# Patient Record
Sex: Female | Born: 1990 | Race: Black or African American | Hispanic: No | Marital: Single | Smoking: Current every day smoker
Health system: Southern US, Community
[De-identification: ages and names within clinical notes are randomized; demographics above are authoritative.]

## PROBLEM LIST (undated history)

## (undated) DIAGNOSIS — E119 Type 2 diabetes mellitus without complications: Secondary | ICD-10-CM

## (undated) HISTORY — DX: Type 2 diabetes mellitus without complications: E11.9

---
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
## 2009-11-14 IMAGING — CR DG FINGER THUMB 2+V*R*
1 series · 1 of 1 positions shown · non-contrast
Comparison: None

CLINICAL DATA: Injured finger on 11/06/2007 with pain and swelling

RIGHT THUMB 2+V

[view not recorded]
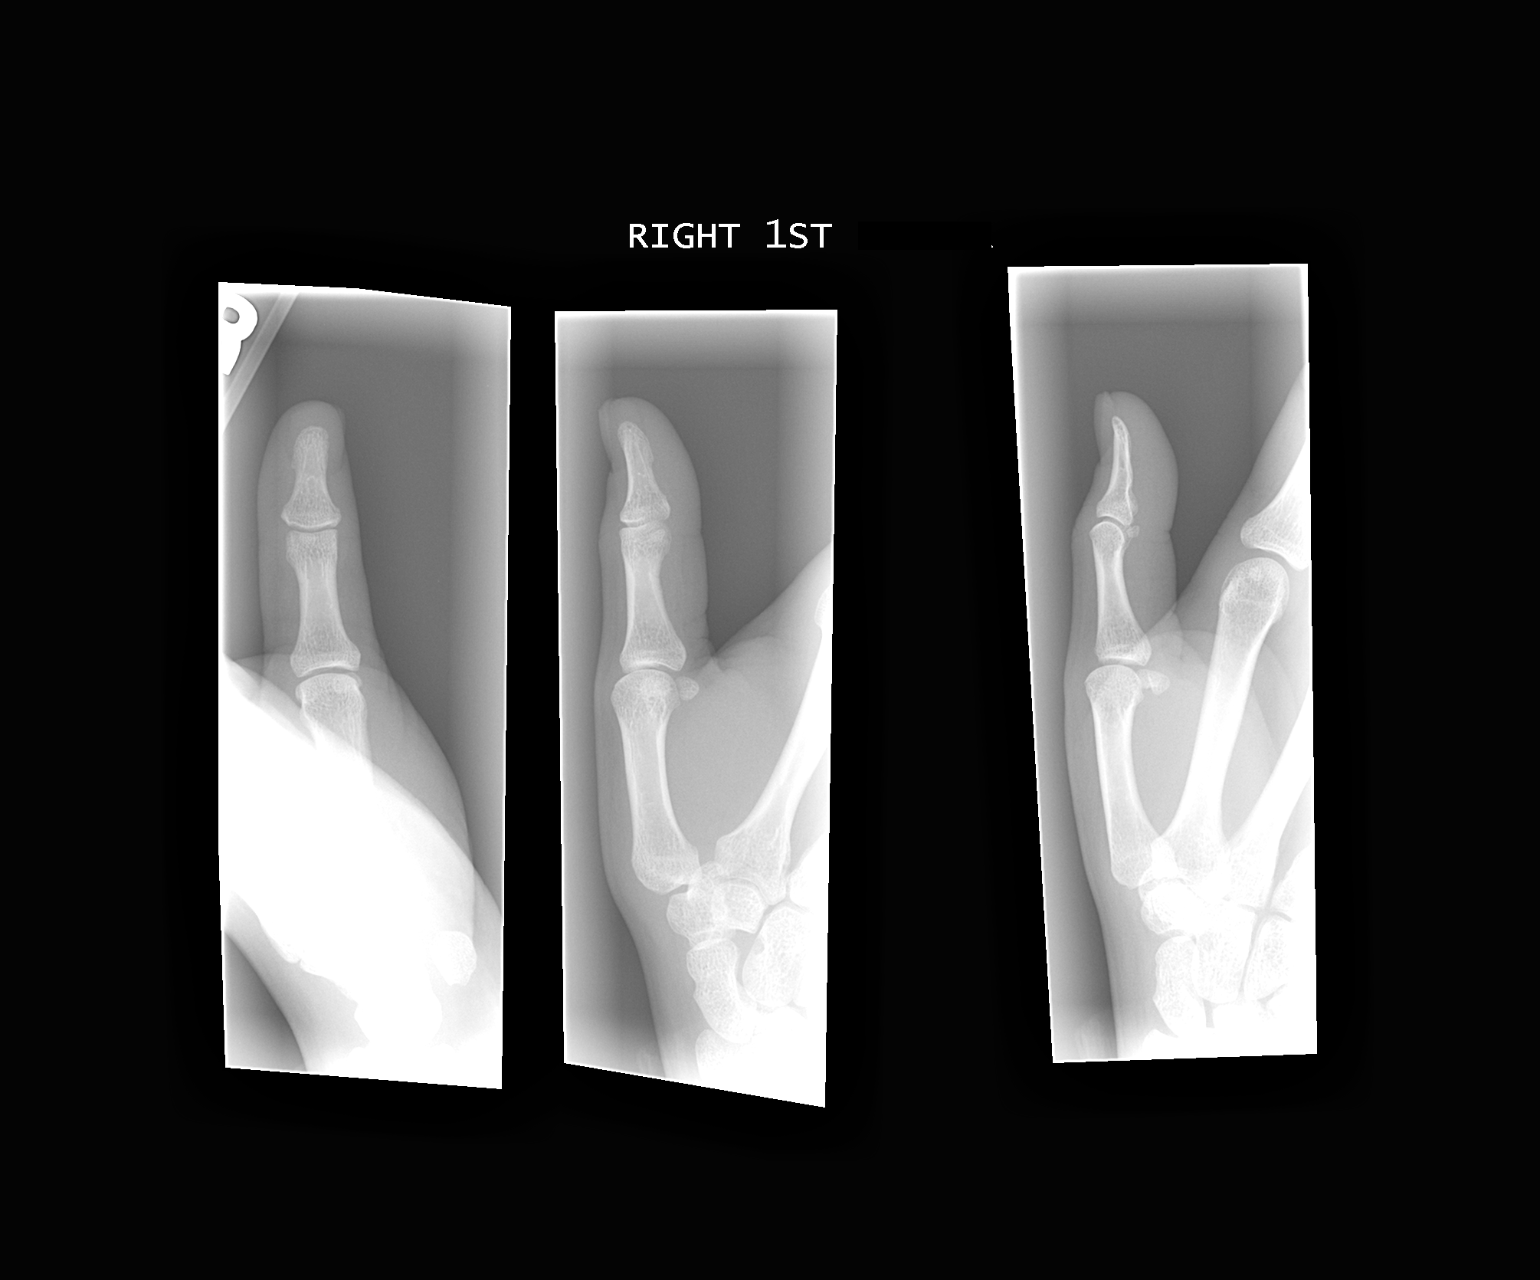

[1 of 1 positions shown; findings below may reference images not displayed]

FINDINGS: No acute bony abnormality is seen.  Alignment is normal.
IMPRESSION: No fracture or dislocation.

## 2010-10-06 ENCOUNTER — Ambulatory Visit (INDEPENDENT_AMBULATORY_CARE_PROVIDER_SITE_OTHER): Payer: Self-pay | Admitting: Internal Medicine

## 2010-10-06 ENCOUNTER — Encounter: Payer: Self-pay | Admitting: Internal Medicine

## 2010-10-06 VITALS — BP 100/62 | HR 90 | Temp 98.9°F | Resp 18 | Ht 62.0 in | Wt 143.0 lb

## 2010-10-06 DIAGNOSIS — E010 Iodine-deficiency related diffuse (endemic) goiter: Secondary | ICD-10-CM

## 2010-10-06 DIAGNOSIS — E049 Nontoxic goiter, unspecified: Secondary | ICD-10-CM

## 2010-10-06 MED ORDER — FLUTICASONE PROPIONATE 50 MCG/ACT NA SUSP: 1.0000 | Freq: Every day | NASAL | Status: DC

## 2010-10-06 NOTE — Progress Notes (Signed)
  Subjective:    Patient ID: Shari Faulkner, female    DOB: [birth year], 20 y.o.   MRN: [id number]  HPI   20 year old patient who is seen today to establish with our practice. 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   For the past 3 days she has noted some sinus congestion and fullness. She has had no fever or purulent drainage postnasal drip. She has been using Benadryl with the modest benefit. She states that springtime usually is associated with some red itchy eyes.  Her past medical history is unremarkable except for wisdom teeth extraction.  Family history is remarkable for hypertension with her father one brother and one sister are in good health  A grandmother had breast cancer diabetes and cerebral vascular disease  Review of Systems  Constitutional: Negative.   HENT: Negative for hearing loss, congestion, sore throat, rhinorrhea, dental problem, sinus pressure and tinnitus.   Eyes: Negative for pain, discharge and visual disturbance.  Respiratory: Negative for cough and shortness of breath.   Cardiovascular: Negative for chest pain, palpitations and leg swelling.  Gastrointestinal: Negative for nausea, vomiting, abdominal pain, diarrhea, constipation, blood in stool and abdominal distention.  Genitourinary: Negative for dysuria, urgency, frequency, hematuria, flank pain, vaginal bleeding, vaginal discharge, difficulty urinating, vaginal pain and pelvic pain.  Musculoskeletal: Negative for joint swelling, arthralgias and gait problem.  Skin: Negative for rash.  Neurological: Negative for dizziness, syncope, speech difficulty, weakness, numbness and headaches.  Hematological: Negative for adenopathy.  Psychiatric/Behavioral: Negative for behavioral problems, dysphoric mood and agitation. The patient is not nervous/anxious.        Objective:   Physical Exam  Constitutional: She is oriented to person, place, and time. She appears well-developed and well-nourished.    HENT:  Head: Normocephalic and atraumatic.  Right Ear: External ear normal.  Left Ear: External ear normal.  Mouth/Throat: Oropharynx is clear and moist.  Eyes: Conjunctivae and EOM are normal. Left eye exhibits no discharge.  Neck: Normal range of motion. Neck supple. No JVD present. Thyromegaly present.        Moderate-sized thyromegaly noted  Cardiovascular: Normal rate, regular rhythm, normal heart sounds and intact distal pulses.   No murmur heard. Pulmonary/Chest: Effort normal and breath sounds normal. She has no wheezes. She has no rales.  Abdominal: Soft. Bowel sounds are normal. She exhibits no distension and no mass. There is no tenderness. There is no rebound and no guarding.  Musculoskeletal: Normal range of motion. She exhibits no edema and no tenderness.  Neurological: She is alert and oriented to person, place, and time. She has normal reflexes. No cranial nerve deficit. She exhibits normal muscle tone. Coordination normal.  Skin: Skin is warm and dry. No rash noted.  Psychiatric: She has a normal mood and affect. Her behavior is normal.          Assessment & Plan:   URI versus allergic rhinitis. We'll recommend over-the-counter Allegra or Alavert. We'll give a prescription for Flonase if symptoms persist. We'll check a TSH  Thyromegaly. We'll check a TSH

## 2010-10-06 NOTE — Patient Instructions (Signed)
Call or return to clinic prn if these symptoms worsen or fail to improve as anticipated.

## 2021-08-12 DIAGNOSIS — Z6836 Body mass index (BMI) 36.0-36.9, adult: Secondary | ICD-10-CM | POA: Diagnosis not present

## 2021-08-12 DIAGNOSIS — Z01419 Encounter for gynecological examination (general) (routine) without abnormal findings: Secondary | ICD-10-CM | POA: Diagnosis not present

## 2021-08-12 DIAGNOSIS — F419 Anxiety disorder, unspecified: Secondary | ICD-10-CM | POA: Diagnosis not present

## 2021-08-12 DIAGNOSIS — Z634 Disappearance and death of family member: Secondary | ICD-10-CM | POA: Diagnosis not present

## 2021-08-27 DIAGNOSIS — Z113 Encounter for screening for infections with a predominantly sexual mode of transmission: Secondary | ICD-10-CM | POA: Diagnosis not present

## 2021-08-27 DIAGNOSIS — N898 Other specified noninflammatory disorders of vagina: Secondary | ICD-10-CM | POA: Diagnosis not present

## 2021-11-26 DIAGNOSIS — L810 Postinflammatory hyperpigmentation: Secondary | ICD-10-CM | POA: Diagnosis not present

## 2021-11-26 DIAGNOSIS — L74510 Primary focal hyperhidrosis, axilla: Secondary | ICD-10-CM | POA: Diagnosis not present

## 2022-02-13 DIAGNOSIS — N898 Other specified noninflammatory disorders of vagina: Secondary | ICD-10-CM | POA: Diagnosis not present

## 2022-02-13 DIAGNOSIS — R102 Pelvic and perineal pain: Secondary | ICD-10-CM | POA: Diagnosis not present

## 2022-02-13 DIAGNOSIS — E559 Vitamin D deficiency, unspecified: Secondary | ICD-10-CM | POA: Diagnosis not present

## 2022-04-07 DIAGNOSIS — N898 Other specified noninflammatory disorders of vagina: Secondary | ICD-10-CM | POA: Diagnosis not present

## 2022-04-07 DIAGNOSIS — G4700 Insomnia, unspecified: Secondary | ICD-10-CM | POA: Diagnosis not present

## 2022-04-07 DIAGNOSIS — R319 Hematuria, unspecified: Secondary | ICD-10-CM | POA: Diagnosis not present

## 2022-04-07 DIAGNOSIS — R102 Pelvic and perineal pain: Secondary | ICD-10-CM | POA: Diagnosis not present

## 2022-04-07 DIAGNOSIS — N915 Oligomenorrhea, unspecified: Secondary | ICD-10-CM | POA: Diagnosis not present

## 2022-04-08 DIAGNOSIS — R319 Hematuria, unspecified: Secondary | ICD-10-CM | POA: Diagnosis not present

## 2022-04-08 DIAGNOSIS — R102 Pelvic and perineal pain: Secondary | ICD-10-CM | POA: Diagnosis not present

## 2022-07-13 DIAGNOSIS — N898 Other specified noninflammatory disorders of vagina: Secondary | ICD-10-CM | POA: Diagnosis not present

## 2022-07-13 DIAGNOSIS — Z309 Encounter for contraceptive management, unspecified: Secondary | ICD-10-CM | POA: Diagnosis not present

## 2022-07-13 DIAGNOSIS — N6489 Other specified disorders of breast: Secondary | ICD-10-CM | POA: Diagnosis not present

## 2022-08-12 DIAGNOSIS — Z6833 Body mass index (BMI) 33.0-33.9, adult: Secondary | ICD-10-CM | POA: Diagnosis not present

## 2022-08-12 DIAGNOSIS — R350 Frequency of micturition: Secondary | ICD-10-CM | POA: Diagnosis not present

## 2022-08-12 DIAGNOSIS — Z01419 Encounter for gynecological examination (general) (routine) without abnormal findings: Secondary | ICD-10-CM | POA: Diagnosis not present

## 2022-08-12 DIAGNOSIS — N898 Other specified noninflammatory disorders of vagina: Secondary | ICD-10-CM | POA: Diagnosis not present

## 2022-08-12 DIAGNOSIS — Z124 Encounter for screening for malignant neoplasm of cervix: Secondary | ICD-10-CM | POA: Diagnosis not present

## 2022-08-19 DIAGNOSIS — L304 Erythema intertrigo: Secondary | ICD-10-CM | POA: Diagnosis not present

## 2022-08-19 DIAGNOSIS — N62 Hypertrophy of breast: Secondary | ICD-10-CM | POA: Diagnosis not present

## 2022-08-19 DIAGNOSIS — M542 Cervicalgia: Secondary | ICD-10-CM | POA: Diagnosis not present

## 2022-08-19 DIAGNOSIS — M549 Dorsalgia, unspecified: Secondary | ICD-10-CM | POA: Diagnosis not present

## 2022-09-07 DIAGNOSIS — R81 Glycosuria: Secondary | ICD-10-CM | POA: Diagnosis not present

## 2022-09-08 LAB — LAB REPORT - SCANNED
A1c: 12.1
EGFR: 113

## 2022-10-01 ENCOUNTER — Ambulatory Visit: Payer: Federal, State, Local not specified - PPO | Admitting: Family Medicine

## 2022-10-16 ENCOUNTER — Ambulatory Visit: Payer: Self-pay | Admitting: Family Medicine

## 2022-11-02 ENCOUNTER — Ambulatory Visit: Payer: BC Managed Care – PPO | Admitting: Family Medicine

## 2022-11-02 ENCOUNTER — Encounter: Payer: Self-pay | Admitting: Family Medicine

## 2022-11-02 VITALS — BP 96/60 | HR 75 | Temp 98.6°F | Ht 62.0 in | Wt 166.0 lb

## 2022-11-02 DIAGNOSIS — R739 Hyperglycemia, unspecified: Secondary | ICD-10-CM

## 2022-11-02 DIAGNOSIS — E119 Type 2 diabetes mellitus without complications: Secondary | ICD-10-CM | POA: Insufficient documentation

## 2022-11-02 LAB — POCT GLYCOSYLATED HEMOGLOBIN (HGB A1C): Hemoglobin A1C: 14.1 % — AB (ref 4.0–5.6)

## 2022-11-02 MED ORDER — METFORMIN HCL 500 MG PO TABS: ORAL_TABLET | ORAL | 1 refills | Status: DC

## 2022-11-02 NOTE — Patient Instructions (Signed)
Start metformin 500 mg once in the morning for 7 days                           500 mg twice a day for 7 days, then                           1000 mg in the morning and 500 mg  at night for 7 days, then                           1000 mg twice a day

## 2022-11-02 NOTE — Progress Notes (Signed)
New Patient Office Visit  Subjective    Patient ID: FEBBIE FORBESS, female    DOB: January 07, 1991  Age: 32 y.o. MRN: [id number]  CC:  Chief Complaint  Patient presents with   Establish Care   Diabetes    Patient states her Alba Cory, [place]  recently diagnosed her with diabetes and advised follow up with PCP    HPI REINA COCKMAN presents to establish care Pt had blood work done recently and it showed that her A1C was "in the diabetic range" and was told to discuss with a primary care. She reports that this bloodwork was done about 2 months ago and since then she has reduced sugar in her diet and states that she has lost about 30 pounds. States her diet consists of tuna, fruit, drinks water and tea only, eats meat and a bit of rice. A1C performed in office today and is 14.1 We had a long discussion about medications and she is agreeable to starting metformin and titrating the dose up to 1000 mg BID.   Pt reports a family history of diabetes in her father and brother.   I reviewed all aspects of the patient's medical history including social history, family history, surgical history.   Outpatient Encounter Medications as of 11/02/2022  Medication Sig   metFORMIN (GLUCOPHAGE) 500 MG tablet Take 1 tablet (500 mg total) by mouth daily with breakfast for 7 days, THEN 1 tablet (500 mg total) 2 (two) times daily with a meal for 7 days. Then increase to 1000 mg in the morning and 500 at night for 7 days, then 1000 mg twice a day.Marland Kitchen   VITAMIN D PO Take 400 Units by mouth 2 (two) times daily.   [DISCONTINUED] fluticasone (FLONASE) 50 MCG/ACT nasal spray 1 spray by Nasal route daily.   No facility-administered encounter medications on file as of 11/02/2022.    Past Medical History:  Diagnosis Date   Diabetes     History reviewed. No pertinent surgical history.  Family History  Problem Relation Age of Onset   Ovarian cancer Mother    Brain cancer Mother    Hypertension Father     Diabetes Father    Prostate cancer Father    Diabetes Brother    Diabetes Maternal Grandmother    Breast cancer Paternal Grandmother     Social History   Socioeconomic History   Marital status: Single    Spouse name: Not on file   Number of children: Not on file   Years of education: Not on file   Highest education level: Not on file  Occupational History   Not on file  Tobacco Use   Smoking status: Never   Smokeless tobacco: Never  Vaping Use   Vaping Use: Former  Substance and Sexual Activity   Alcohol use: Not Currently   Drug use: Not Currently    Types: Marijuana   Sexual activity: Yes  Other Topics Concern   Not on file  Social History Narrative   Not on file   Social Determinants of Health   Financial Resource Strain: Not on file  Food Insecurity: Not on file  Transportation Needs: Not on file  Physical Activity: Not on file  Stress: Not on file  Social Connections: Not on file  Intimate Partner Violence: Not on file    Review of Systems  All other systems reviewed and are negative.       Objective    BP 96/60 (BP Location: Left Arm,  Patient Position: Sitting, Cuff Size: Normal)   Pulse 75   Temp 98.6 F (37 C) (Oral)   Ht 5\' 2"  (1.575 m)   Wt 166 lb (75.3 kg)   LMP 10/02/2022   SpO2 99%   BMI 30.36 kg/m   Physical Exam Vitals reviewed.  Constitutional:      Appearance: Normal appearance. She is well-groomed and overweight.  Eyes:     Conjunctiva/sclera: Conjunctivae normal.  Neck:     Thyroid: No thyromegaly.  Cardiovascular:     Rate and Rhythm: Normal rate and regular rhythm.     Pulses: Normal pulses.     Heart sounds: S1 normal and S2 normal.  Pulmonary:     Effort: Pulmonary effort is normal.     Breath sounds: Normal breath sounds and air entry.  Abdominal:     General: Bowel sounds are normal.  Musculoskeletal:     Right lower leg: No edema.     Left lower leg: No edema.  Neurological:     Mental Status: She is alert  and oriented to person, place, and time. Mental status is at baseline.     Gait: Gait is intact.  Psychiatric:        Mood and Affect: Mood and affect normal.        Speech: Speech normal.        Behavior: Behavior normal.        Judgment: Judgment normal.         Assessment & Plan:   Problem List Items Addressed This Visit       Unprioritized   Diabetes mellitus without complication (Chronic)    We had a long discussion about medications and also diet control. I gave her a list of low carb foods and I gave her specific goals on carb intake. I will see her every 3 months for medication adjustment and A1C check until her blood sugar is under control.       Relevant Medications   metFORMIN (GLUCOPHAGE) 500 MG tablet   Other Visit Diagnoses     Hyperglycemia    -  Primary   Relevant Orders   POC HgB A1c (Completed)       Return in about 3 months (around 02/01/2023) for DM.   Karie Georges, MD

## 2022-11-04 NOTE — Assessment & Plan Note (Signed)
We had a long discussion about medications and also diet control. I gave her a list of low carb foods and I gave her specific goals on carb intake. I will see her every 3 months for medication adjustment and A1C check until her blood sugar is under control.

## 2022-11-05 DIAGNOSIS — N898 Other specified noninflammatory disorders of vagina: Secondary | ICD-10-CM | POA: Diagnosis not present

## 2022-12-07 DIAGNOSIS — S46912A Strain of unspecified muscle, fascia and tendon at shoulder and upper arm level, left arm, initial encounter: Secondary | ICD-10-CM | POA: Diagnosis not present

## 2022-12-07 DIAGNOSIS — S46911A Strain of unspecified muscle, fascia and tendon at shoulder and upper arm level, right arm, initial encounter: Secondary | ICD-10-CM | POA: Diagnosis not present

## 2022-12-07 DIAGNOSIS — S39012A Strain of muscle, fascia and tendon of lower back, initial encounter: Secondary | ICD-10-CM | POA: Diagnosis not present

## 2023-02-01 ENCOUNTER — Ambulatory Visit: Payer: BC Managed Care – PPO | Admitting: Family Medicine

## 2023-02-01 ENCOUNTER — Encounter: Payer: Self-pay | Admitting: Family Medicine

## 2023-02-01 VITALS — BP 104/62 | HR 60 | Temp 98.6°F | Ht 62.0 in | Wt 156.3 lb

## 2023-02-01 DIAGNOSIS — E119 Type 2 diabetes mellitus without complications: Secondary | ICD-10-CM

## 2023-02-01 DIAGNOSIS — Z7984 Long term (current) use of oral hypoglycemic drugs: Secondary | ICD-10-CM | POA: Diagnosis not present

## 2023-02-01 DIAGNOSIS — R110 Nausea: Secondary | ICD-10-CM

## 2023-02-01 LAB — POCT GLYCOSYLATED HEMOGLOBIN (HGB A1C): Hemoglobin A1C: 13.2 % — AB (ref 4.0–5.6)

## 2023-02-01 MED ORDER — METFORMIN HCL ER 750 MG PO TB24: 750.0000 mg | ORAL_TABLET | Freq: Every day | ORAL | 1 refills | Status: DC

## 2023-02-01 MED ORDER — ONDANSETRON HCL 4 MG PO TABS: 4.0000 mg | ORAL_TABLET | Freq: Three times a day (TID) | ORAL | 2 refills | Status: DC | PRN

## 2023-02-01 NOTE — Assessment & Plan Note (Signed)
A1C is better at 13.2, however she could not titrate up the metformin like we planned due to severe nausea, she was able to tolerate 500 mg daily. We discussed options and we will switch her to long acting metformin at 750 mg daily. Patient will also start berberine 500-600 mg three times daily to reduce blood sugars. RTC in 3 months for repeat A1C. I have requested her bloodwork from her previous provider for review.

## 2023-02-01 NOTE — Progress Notes (Signed)
Established Patient Office Visit  Subjective   Patient ID: Shari Faulkner, female    DOB: 09/14/1990  Age: 32 y.o. MRN: [id number]  Chief Complaint  Patient presents with   Medical Management of Chronic Issues    Patient states she is only taking Metformin once a day due to "feeling uneasy and nauseous"    Pt is here for follow up today. States that she is working out more, but she thinks that her sugars are still not where they should be, states that she could not increase the metformin due to nausea, is only taking 500 mg daily. Foot exam performed today and her A1C was 13.2, down from 14.1. we discussed different medication options including GLP, SGLT 2 and berberine. Pt wants to try the berberine first.     Current Outpatient Medications  Medication Instructions   metFORMIN (GLUCOPHAGE-XR) 750 mg, Oral, Daily with breakfast   ondansetron (ZOFRAN) 4 mg, Oral, Every 8 hours PRN   VITAMIN D PO 400 Units, Oral, 2 times daily       Review of Systems  All other systems reviewed and are negative.     Objective:     BP 104/62 (BP Location: Left Arm, Patient Position: Sitting, Cuff Size: Normal)   Pulse 60   Temp 98.6 F (37 C) (Oral)   Ht 5\' 2"  (1.575 m)   Wt 156 lb 4.8 oz (70.9 kg)   LMP 01/27/2023 (Exact Date)   SpO2 98%   BMI 28.59 kg/m     Physical Exam Vitals reviewed.  Constitutional:      Appearance: Normal appearance. She is well-groomed and normal weight.  Eyes:     Conjunctiva/sclera: Conjunctivae normal.  Cardiovascular:     Pulses: Normal pulses.  Pulmonary:     Effort: Pulmonary effort is normal.     Breath sounds: Normal air entry.  Musculoskeletal:     Right lower leg: No edema.     Left lower leg: No edema.  Neurological:     Mental Status: She is alert and oriented to person, place, and time. Mental status is at baseline.     Gait: Gait is intact.  Psychiatric:        Mood and Affect: Mood and affect normal.        Speech: Speech normal.         Behavior: Behavior normal.        Judgment: Judgment normal.    Diabetic Foot Exam - Simple   Simple Foot Form Diabetic Foot exam was performed with the following findings: Yes 02/01/2023 10:25 AM  Visual Inspection No deformities, no ulcerations, no other skin breakdown bilaterally: Yes Sensation Testing Intact to touch and monofilament testing bilaterally: Yes Pulse Check Posterior Tibialis and Dorsalis pulse intact bilaterally: Yes Comments      Results for orders placed or performed in visit on 02/01/23  POC HgB A1c  Result Value Ref Range   Hemoglobin A1C 13.2 (A) 4.0 - 5.6 %   HbA1c POC (<> result, manual entry)     HbA1c, POC (prediabetic range)     HbA1c, POC (controlled diabetic range)         The ASCVD Risk score (Arnett DK, et al., 2019) failed to calculate for the following reasons:   The 2019 ASCVD risk score is only valid for ages 53 to 10    Assessment & Plan:  Diabetes mellitus without complication (HCC) Assessment & Plan: A1C is better at 13.2, however she could  not titrate up the metformin like we planned due to severe nausea, she was able to tolerate 500 mg daily. We discussed options and we will switch her to long acting metformin at 750 mg daily. Patient will also start berberine 500-600 mg three times daily to reduce blood sugars. RTC in 3 months for repeat A1C. I have requested her bloodwork from her previous provider for review.   Orders: -     POCT glycosylated hemoglobin (Hb A1C) -     metFORMIN HCl ER; Take 1 tablet (750 mg total) by mouth daily with breakfast.  Dispense: 90 tablet; Refill: 1  Nausea -     Ondansetron HCl; Take 1 tablet (4 mg total) by mouth every 8 (eight) hours as needed for nausea or vomiting.  Dispense: 30 tablet; Refill: 2   Will treat her nausea with the metformin PRN with ondansetron.   Return in about 3 months (around 05/04/2023) for DM-- A1C recheck.    Karie Georges, MD

## 2023-02-01 NOTE — Patient Instructions (Addendum)
Try berberine -- 500 or 600 mg take one 30 minutes before each meal.   Options for medications:  Ozempic, mounjaro, or trulicity -- once weekly injectables for diabetes (GLP)  Jardiance or Farxiga-- once daily tablets to help lower blood sugars (SGLT-2)

## 2023-03-21 ENCOUNTER — Emergency Department (HOSPITAL_BASED_OUTPATIENT_CLINIC_OR_DEPARTMENT_OTHER)
Admission: EM | Admit: 2023-03-21 | Discharge: 2023-03-22 | Disposition: A | Discharge status: Home / Self Care | Payer: BC Managed Care – PPO | Attending: Emergency Medicine | Admitting: Emergency Medicine

## 2023-03-21 ENCOUNTER — Emergency Department (HOSPITAL_BASED_OUTPATIENT_CLINIC_OR_DEPARTMENT_OTHER): Payer: BC Managed Care – PPO

## 2023-03-21 ENCOUNTER — Encounter (HOSPITAL_BASED_OUTPATIENT_CLINIC_OR_DEPARTMENT_OTHER): Payer: Self-pay | Admitting: Emergency Medicine

## 2023-03-21 ENCOUNTER — Other Ambulatory Visit: Payer: Self-pay

## 2023-03-21 DIAGNOSIS — R319 Hematuria, unspecified: Secondary | ICD-10-CM | POA: Diagnosis not present

## 2023-03-21 DIAGNOSIS — E860 Dehydration: Secondary | ICD-10-CM | POA: Insufficient documentation

## 2023-03-21 DIAGNOSIS — R55 Syncope and collapse: Secondary | ICD-10-CM | POA: Insufficient documentation

## 2023-03-21 DIAGNOSIS — R739 Hyperglycemia, unspecified: Secondary | ICD-10-CM

## 2023-03-21 DIAGNOSIS — E1165 Type 2 diabetes mellitus with hyperglycemia: Secondary | ICD-10-CM | POA: Insufficient documentation

## 2023-03-21 DIAGNOSIS — Z7984 Long term (current) use of oral hypoglycemic drugs: Secondary | ICD-10-CM | POA: Insufficient documentation

## 2023-03-21 DIAGNOSIS — R7309 Other abnormal glucose: Secondary | ICD-10-CM | POA: Diagnosis not present

## 2023-03-21 DIAGNOSIS — G249 Dystonia, unspecified: Secondary | ICD-10-CM | POA: Diagnosis not present

## 2023-03-21 DIAGNOSIS — R002 Palpitations: Secondary | ICD-10-CM | POA: Diagnosis not present

## 2023-03-21 DIAGNOSIS — R079 Chest pain, unspecified: Secondary | ICD-10-CM | POA: Diagnosis not present

## 2023-03-21 LAB — BASIC METABOLIC PANEL
Anion gap: 8 (ref 5–15)
BUN: 15 mg/dL (ref 6–20)
CO2: 25 mmol/L (ref 22–32)
Calcium: 9 mg/dL (ref 8.9–10.3)
Chloride: 99 mmol/L (ref 98–111)
Creatinine, Ser: 0.66 mg/dL (ref 0.44–1.00)
GFR, Estimated: >60 mL/min (ref >60)
Glucose, Bld: 288 mg/dL — ABNORMAL HIGH (ref 70–99)
Potassium: 3.9 mmol/L (ref 3.5–5.1)
Sodium: 132 mmol/L — ABNORMAL LOW (ref 135–145)

## 2023-03-21 LAB — CBC
HCT: 39.7 % (ref 36.0–46.0)
Hemoglobin: 13.2 g/dL (ref 12.0–15.0)
MCH: 27.9 pg (ref 26.0–34.0)
MCHC: 33.2 g/dL (ref 30.0–36.0)
MCV: 83.9 fL (ref 80.0–100.0)
Platelets: 186 10*3/uL (ref 150–400)
RBC: 4.73 MIL/uL (ref 3.87–5.11)
RDW: 13.1 % (ref 11.5–15.5)
WBC: 13.8 10*3/uL — ABNORMAL HIGH (ref 4.0–10.5)
nRBC: 0 % (ref 0.0–0.2)

## 2023-03-21 LAB — URINALYSIS, ROUTINE W REFLEX MICROSCOPIC
Bilirubin Urine: NEGATIVE
Glucose, UA: >=500 mg/dL — AB
Hgb urine dipstick: NEGATIVE
Ketones, ur: 40 mg/dL — AB
Leukocytes,Ua: NEGATIVE
Nitrite: NEGATIVE
Protein, ur: NEGATIVE mg/dL
Specific Gravity, Urine: 1.025 (ref 1.005–1.030)
pH: 7 (ref 5.0–8.0)

## 2023-03-21 LAB — PREGNANCY, URINE: Preg Test, Ur: NEGATIVE

## 2023-03-21 LAB — URINALYSIS, MICROSCOPIC (REFLEX)

## 2023-03-21 LAB — CBG MONITORING, ED: Glucose-Capillary: 284 mg/dL — ABNORMAL HIGH (ref 70–99)

## 2023-03-21 MED ORDER — SODIUM CHLORIDE 0.9 % IV BOLUS
1000.0000 mL | Freq: Once | INTRAVENOUS | Status: AC
  Administered 2023-03-21: 1000 mL via INTRAVENOUS

## 2023-03-21 MED ORDER — KETOROLAC TROMETHAMINE 15 MG/ML IJ SOLN
15.0000 mg | Freq: Once | INTRAMUSCULAR | Status: AC
  Administered 2023-03-21: 15 mg via INTRAVENOUS
  Filled 2023-03-21: qty 1

## 2023-03-21 MED ORDER — LORAZEPAM 1 MG PO TABS
1.0000 mg | ORAL_TABLET | Freq: Once | ORAL | Status: AC
  Administered 2023-03-21: 1 mg via ORAL
  Filled 2023-03-21: qty 1

## 2023-03-21 NOTE — ED Provider Notes (Signed)
[facility] Provider Note   CSN: [id number] Arrival date & time: 03/21/23  2027     History  Chief Complaint  Patient presents with   Loss of Consciousness   Hyperglycemia   With past medical history screen for diabetes, thyromegaly who presents with concern for loss of consciousness today at 4 PM.  Patient was seen in urgent care, referred to the emergency department for elevated blood sugar, concern for possible DKA.  She endorses some blurred vision, increased thirst, she is taking metformin but endorses that she does not know how to adequately deal with her new diabetes diagnosis.  She reports that she was having some chest pain and shortness of breath on Friday.  She reports when she passed out she did feel some abnormal sensation movement in the left hand which is since resolved.     Loss of Consciousness Hyperglycemia Associated symptoms: syncope        Home Medications Prior to Admission medications   Medication Sig Start Date End Date Taking? Authorizing Provider  metFORMIN (GLUCOPHAGE-XR) 750 MG 24 hr tablet Take 1 tablet (750 mg total) by mouth daily with breakfast. 02/01/23   Karie Georges, MD  ondansetron (ZOFRAN) 4 MG tablet Take 1 tablet (4 mg total) by mouth every 8 (eight) hours as needed for nausea or vomiting. 02/01/23   Karie Georges, MD  VITAMIN D PO Take 400 Units by mouth 2 (two) times daily.    [provider]      Allergies    Bergamot    Review of Systems   Review of Systems  Cardiovascular:  Positive for syncope.  All other systems reviewed and are negative.   Physical Exam Updated Vital Signs BP 104/70   Pulse 86   Temp (!) 96.9 F (36.1 C)   Resp 14   Ht 5\' 2"  (1.575 m)   Wt 70.8 kg   LMP 02/17/2023 (Exact Date)   SpO2 100%   BMI 28.53 kg/m  Physical Exam Vitals and nursing note reviewed.  Constitutional:      General: She is not in acute distress.    Appearance: Normal  appearance.  HENT:     Head: Normocephalic and atraumatic.  Eyes:     General:        Right eye: No discharge.        Left eye: No discharge.  Cardiovascular:     Rate and Rhythm: Normal rate and regular rhythm.     Heart sounds: No murmur heard.    No friction rub. No gallop.  Pulmonary:     Effort: Pulmonary effort is normal.     Breath sounds: Normal breath sounds.  Abdominal:     General: Bowel sounds are normal.     Palpations: Abdomen is soft.  Skin:    General: Skin is warm and dry.     Capillary Refill: Capillary refill takes less than 2 seconds.  Neurological:     Mental Status: She is alert and oriented to person, place, and time.     Comments: Moves all 4 limbs spontaneously, CN II through XII grossly intact, can ambulate without difficulty, intact sensation throughout.   Psychiatric:        Mood and Affect: Mood normal.        Behavior: Behavior normal.     ED Results / Procedures / Treatments   Labs (all labs ordered are listed, but only abnormal results are displayed) Labs  Reviewed  BASIC METABOLIC PANEL - Abnormal; Notable for the following components:      Result Value   Sodium 132 (*)    Glucose, Bld 288 (*)    All other components within normal limits  CBC - Abnormal; Notable for the following components:   WBC 13.8 (*)    All other components within normal limits  URINALYSIS, ROUTINE W REFLEX MICROSCOPIC - Abnormal; Notable for the following components:   Glucose, UA >=500 (*)    Ketones, ur 40 (*)    All other components within normal limits  URINALYSIS, MICROSCOPIC (REFLEX) - Abnormal; Notable for the following components:   Bacteria, UA FEW (*)    All other components within normal limits  CBG MONITORING, ED - Abnormal; Notable for the following components:   Glucose-Capillary 284 (*)    All other components within normal limits  PREGNANCY, URINE  BETA-HYDROXYBUTYRIC ACID  TROPONIN I (HIGH SENSITIVITY)    EKG EKG  Interpretation Date/Time:  Sunday March 21 2023 21:09:18 EDT Ventricular Rate:  68 PR Interval:  140 QRS Duration:  66 QT Interval:  410 QTC Calculation: 435 R Axis:   68  Text Interpretation: Normal sinus rhythm Low voltage QRS Borderline ECG No previous ECGs available Confirmed by Pricilla Loveless [phone]) on 03/21/2023 10:36:58 PM  Radiology DG Chest 2 View  Result Date: 03/21/2023 CLINICAL DATA:  Chest pain for several hours, initial encounter EXAM: CHEST - 2 VIEW COMPARISON:  None Available. FINDINGS: The heart size and mediastinal contours are within normal limits. Both lungs are clear. The visualized skeletal structures show mild scoliosis concave to the left. IMPRESSION: No acute abnormality noted. Electronically Signed   By: Alcide Clever M.D.   On: 03/21/2023 22:54   CT Head Wo Contrast  Result Date: 03/21/2023 CLINICAL DATA:  Syncope. EXAM: CT HEAD WITHOUT CONTRAST TECHNIQUE: Contiguous axial images were obtained from the base of the skull through the vertex without intravenous contrast. RADIATION DOSE REDUCTION: This exam was performed according to the departmental dose-optimization program which includes automated exposure control, adjustment of the mA and/or kV according to patient size and/or use of iterative reconstruction technique. COMPARISON:  None Available. FINDINGS: Brain: No evidence of acute infarction, hemorrhage, hydrocephalus, extra-axial collection or mass lesion/mass effect. Vascular: No hyperdense vessel or unexpected calcification. Skull: Normal. Negative for fracture or focal lesion. Sinuses/Orbits: No acute finding. Other: None. IMPRESSION: Normal head CT. Electronically Signed   By: Aram Candela M.D.   On: 03/21/2023 22:44    Procedures Procedures    Medications Ordered in ED Medications  LORazepam (ATIVAN) tablet 1 mg (1 mg Oral Given 03/21/23 2234)  sodium chloride 0.9 % bolus 1,000 mL (1,000 mLs Intravenous New Bag/Given 03/21/23 2334)  ketorolac  (TORADOL) 15 MG/ML injection 15 mg (15 mg Intravenous Given 03/21/23 2332)    ED Course/ Medical Decision Making/ A&P                                 Medical Decision Making Amount and/or Complexity of Data Reviewed Labs: ordered.   This patient is a 32 y.o. female  who presents to the ED for concern of syncope, concern for possible developing DKA.   Differential diagnoses prior to evaluation: The emergent differential diagnosis includes, but is not limited to,  CVA, ACS, arrhythmia, vasovagal syncope, orthostatic hypotension, sepsis, hypoglycemia, electrolyte disturbance, respiratory failure, symptomatic anemia, dehydration, heat injury, polypharmacy, malignancy, anxiety/panic attack.  . This  is not an exhaustive differential.   Past Medical History / Co-morbidities / Social History: Diabetes, thyromegaly  Additional history: Chart reviewed. Pertinent results include: Reviewed outpatient family medicine evaluation  Physical Exam: Physical exam performed. The pertinent findings include: Patient anxious, tearful on initial exam, she has no focal neurologic deficits.  Her vital signs are overall stable, although she does have some dry mucous membranes suggestive of some mild dehydration  Lab Tests/Imaging studies: I personally interpreted labs/imaging and the pertinent results include: CBC notable for moderate leukocytosis, white blood cells 13.8 although I do suspect some degree of hemoconcentration secondary to dehydration.  She is a pseudohyponatremia, sodium 132 in context of glucose 288, although may have some mild history of hyponatremia, sodium corrected by around 134.  UA with some glucose and protein, but no evidence of acute urinary tract infection.  Troponin and bhb pending at this time.  I independently interpreted plain film chest x-ray as well as CT head without contrast which shows no evidence of acute intrathoracic or intracranial abnormalities I agree with the radiologist  interpretation.  Cardiac monitoring: EKG obtained and interpreted by myself and attending physician which shows: Normal sinus rhythm   Medications: I ordered medication including fluids, Toradol, Ativan for anxiety.  I have reviewed the patients home medicines and have made adjustments as needed.   Disposition: After consideration of the diagnostic results and the patients response to treatment, I feel that overall patient with signs and symptoms of some dehydration, hyperglycemia, no evidence of neurogenic, cardiogenic syncope, suspect that patient may have had some orthostatic hypotension versus vasovagal syncope, she may be having some symptoms related to stress as well.  Patient provided resources for diabetes.   12:05 AM Care of Jerin Rowen Stewardtransferred to Dr. Judd Lien at the end of my shift as the patient will require reassessment once labs/imaging have resulted. Patient presentation, ED course, and plan of care discussed with review of all pertinent labs and imaging. Please see his/her note for further details regarding further ED course and disposition. Plan at time of handoff is pending troponin, if normal okay for discharge with plan as above. This may be altered or completely changed at the discretion of the oncoming team pending results of further workup.   Final Clinical Impression(s) / ED Diagnoses Final diagnoses:  Dehydration  Hyperglycemia  Syncope and collapse    Rx / DC Orders ED Discharge Orders     None         Olene Floss, PA-C 03/22/23 0005    Pricilla Loveless, MD 03/25/23 [phone]

## 2023-03-21 NOTE — ED Triage Notes (Signed)
Patient arrived via POV c/o LOC at 1600. Patient seen at [facility], referred for hyperglycemia. Patient states blurred vision, increased thirst. Patient taking metformin. Patient is AO x 4, VS WDL, normal gait.

## 2023-03-21 NOTE — ED Notes (Signed)
Tiiu RN approved patient to transport to imaging w/o monitor.  Returned to rm 1 with no change in condition.

## 2023-03-21 NOTE — ED Provider Notes (Incomplete)
[facility] Provider Note   CSN: [id number] Arrival date & time: 03/21/23  2027     History {Add pertinent medical, surgical, social history, OB history to HPI:1} Chief Complaint  Patient presents with  . Loss of Consciousness  . Hyperglycemia   With past medical history screen for diabetes, thyromegaly who presents with concern for loss of consciousness today at 4 PM.  Patient was seen in urgent care, referred to the emergency department for elevated blood sugar, concern for possible DKA.  She endorses some blurred vision, increased thirst, she is taking metformin but endorses that she does not know how to adequately deal with her new diabetes diagnosis.  She reports that she was having some chest pain and shortness of breath on Friday.  She reports when she passed out she did feel some abnormal sensation movement in the left hand which is since resolved.     Loss of Consciousness Hyperglycemia Associated symptoms: syncope        Home Medications Prior to Admission medications   Medication Sig Start Date End Date Taking? Authorizing Provider  metFORMIN (GLUCOPHAGE-XR) 750 MG 24 hr tablet Take 1 tablet (750 mg total) by mouth daily with breakfast. 02/01/23   Karie Georges, MD  ondansetron (ZOFRAN) 4 MG tablet Take 1 tablet (4 mg total) by mouth every 8 (eight) hours as needed for nausea or vomiting. 02/01/23   Karie Georges, MD  VITAMIN D PO Take 400 Units by mouth 2 (two) times daily.    [provider]      Allergies    Bergamot    Review of Systems   Review of Systems  Cardiovascular:  Positive for syncope.  All other systems reviewed and are negative.   Physical Exam Updated Vital Signs BP 104/70   Pulse 86   Temp (!) 96.9 F (36.1 C)   Resp 14   Ht 5\' 2"  (1.575 m)   Wt 70.8 kg   LMP 02/17/2023 (Exact Date)   SpO2 100%   BMI 28.53 kg/m  Physical Exam Vitals and nursing note reviewed.   Constitutional:      General: She is not in acute distress.    Appearance: Normal appearance.  HENT:     Head: Normocephalic and atraumatic.  Eyes:     General:        Right eye: No discharge.        Left eye: No discharge.  Cardiovascular:     Rate and Rhythm: Normal rate and regular rhythm.     Heart sounds: No murmur heard.    No friction rub. No gallop.  Pulmonary:     Effort: Pulmonary effort is normal.     Breath sounds: Normal breath sounds.  Abdominal:     General: Bowel sounds are normal.     Palpations: Abdomen is soft.  Skin:    General: Skin is warm and dry.     Capillary Refill: Capillary refill takes less than 2 seconds.  Neurological:     Mental Status: She is alert and oriented to person, place, and time.     Comments: Moves all 4 limbs spontaneously, CN II through XII grossly intact, can ambulate without difficulty, intact sensation throughout.   Psychiatric:        Mood and Affect: Mood normal.        Behavior: Behavior normal.     ED Results / Procedures / Treatments   Labs (all labs ordered  are listed, but only abnormal results are displayed) Labs Reviewed  CBG MONITORING, ED - Abnormal; Notable for the following components:      Result Value   Glucose-Capillary 284 (*)    All other components within normal limits  BASIC METABOLIC PANEL  CBC  URINALYSIS, ROUTINE W REFLEX MICROSCOPIC    EKG None  Radiology No results found.  Procedures Procedures  {Document cardiac monitor, telemetry assessment procedure when appropriate:1}  Medications Ordered in ED Medications - No data to display  ED Course/ Medical Decision Making/ A&P   {   Click here for ABCD2, HEART and other calculatorsREFRESH Note before signing :1}                              Medical Decision Making Amount and/or Complexity of Data Reviewed Labs: ordered.   This patient is a 31 y.o. female  who presents to the ED for concern of syncope, concern for possible developing  DKA.   Differential diagnoses prior to evaluation: The emergent differential diagnosis includes, but is not limited to,  CVA, ACS, arrhythmia, vasovagal syncope, orthostatic hypotension, sepsis, hypoglycemia, electrolyte disturbance, respiratory failure, symptomatic anemia, dehydration, heat injury, polypharmacy, malignancy, anxiety/panic attack.  . This is not an exhaustive differential.   Past Medical History / Co-morbidities / Social History: Diabetes, thyromegaly  Additional history: Chart reviewed. Pertinent results include: Reviewed outpatient family medicine evaluation  Physical Exam: Physical exam performed. The pertinent findings include: Patient anxious, tearful on initial exam, she has no focal neurologic deficits.  Her vital signs are overall stable, although she does have some dry mucous membranes suggestive of some mild dehydration  Lab Tests/Imaging studies: I personally interpreted labs/imaging and the pertinent results include: CBC notable for moderate leukocytosis, white blood cells 13.8 although I do suspect some degree of hemoconcentration secondary to dehydration.  She is a pseudohyponatremia, sodium 132 in context of glucose 288, although may have some mild history of hyponatremia, sodium corrected by around 134.  UA with some glucose and protein, but no evidence of acute urinary tract infection.  Troponin and bhb pending at this time.  I independently interpreted plain film chest x-ray as well as CT head without contrast which shows no evidence of acute intrathoracic or intracranial abnormalities I agree with the radiologist interpretation.  Cardiac monitoring: EKG obtained and interpreted by myself and attending physician which shows: Normal sinus rhythm   Medications: I ordered medication including fluids, Toradol, Ativan for anxiety.  I have reviewed the patients home medicines and have made adjustments as needed.   Disposition: After consideration of the diagnostic  results and the patients response to treatment, I feel that overall patient with signs and symptoms of some dehydration, hyperglycemia, no evidence of neurogenic, cardiogenic syncope, suspect that patient may have had some orthostatic hypotension versus vasovagal syncope, she may be having some symptoms related to stress as well.  Patient provided resources for diabetes.   emergency department workup does not suggest an emergent condition requiring admission or immediate intervention beyond what has been performed at this time. The plan is: as above. The patient is safe for discharge and has been instructed to return immediately for worsening symptoms, change in symptoms or any other concerns.  Final Clinical Impression(s) / ED Diagnoses Final diagnoses:  None    Rx / DC Orders ED Discharge Orders     None

## 2023-03-21 NOTE — Discharge Instructions (Addendum)
Please follow-up closely with your primary care doctor, was unable to schedule the diabetes coordinator to contact you unfortunately at this hour, but I provided some basic resources to help get you started.  I definitely would try to schedule an appointment with your primary care doctor to discuss diabetes education has been in better control of your blood sugar will likely help to prevent episodes such as this.  If you do have future episodes of syncope please return for further evaluation to ensure there is not some additional underlying cause contributing to your symptoms.

## 2023-03-22 DIAGNOSIS — R079 Chest pain, unspecified: Secondary | ICD-10-CM | POA: Diagnosis not present

## 2023-03-22 DIAGNOSIS — R55 Syncope and collapse: Secondary | ICD-10-CM | POA: Diagnosis not present

## 2023-03-22 LAB — BETA-HYDROXYBUTYRIC ACID: Beta-Hydroxybutyric Acid: 0.4 mmol/L — ABNORMAL HIGH (ref 0.05–0.27)

## 2023-03-22 LAB — TROPONIN I (HIGH SENSITIVITY): Troponin I (High Sensitivity): <2 ng/L (ref <18)

## 2023-03-22 MED ORDER — DIPHENHYDRAMINE HCL 25 MG PO CAPS
25.0000 mg | ORAL_CAPSULE | Freq: Once | ORAL | Status: AC
  Administered 2023-03-22: 25 mg via ORAL
  Filled 2023-03-22: qty 1

## 2023-03-22 MED ORDER — MELATONIN 3 MG PO TABS: 3.0000 mg | ORAL_TABLET | Freq: Every day | ORAL | Status: DC

## 2023-03-23 ENCOUNTER — Encounter: Payer: Self-pay | Admitting: Family Medicine

## 2023-03-23 ENCOUNTER — Ambulatory Visit: Payer: BC Managed Care – PPO | Admitting: Family Medicine

## 2023-03-23 DIAGNOSIS — Z7985 Long-term (current) use of injectable non-insulin antidiabetic drugs: Secondary | ICD-10-CM

## 2023-03-23 DIAGNOSIS — E119 Type 2 diabetes mellitus without complications: Secondary | ICD-10-CM

## 2023-03-23 MED ORDER — TIRZEPATIDE 2.5 MG/0.5ML ~~LOC~~ SOAJ
2.5000 mg | SUBCUTANEOUS | 2 refills | Status: DC
Start: 2023-03-23 — End: 2023-05-06

## 2023-03-23 NOTE — Progress Notes (Signed)
Established Patient Office Visit  Subjective   Patient ID: Shari Faulkner, female    DOB: 1991-05-12  Age: 32 y.o. MRN: [id number]  Chief Complaint  Patient presents with   Follow-up    Patient is here for ED follow up today. States that she feels like she hasn't been able to "do the work" to control her diabetes. States that she has had a lot of stress recently, it is the anniversary of her mother's passing. States that when she had the syncopal sensation she was doing her sister's hair when she felt really hot. States that she also got nauseated and took the zofran, then went to lay down that's when she passed out. In the ED her blood sugars were very elevated. WBC was slightly high, they gave her IV fluids and an injection of toradol for a headache.   I reviewed her ED documentation and the labs that were done. I reviewed her medication list and she reports they ordered a CGM for her.     Current Outpatient Medications  Medication Instructions   Continuous Glucose Sensor (DEXCOM G6 SENSOR) MISC 1 each, Subcutaneous, Every 14 days, Change sensors every 10 days   metFORMIN (GLUCOPHAGE-XR) 750 mg, Oral, Daily with breakfast   ondansetron (ZOFRAN) 4 mg, Oral, Every 8 hours PRN   tirzepatide (MOUNJARO) 2.5 mg, Subcutaneous, Weekly   VITAMIN D PO 400 Units, Oral, 2 times daily    Patient Active Problem List   Diagnosis Date Noted   Diabetes mellitus without complication (HCC) 11/02/2022   Thyromegaly 10/06/2010      Review of Systems  All other systems reviewed and are negative.     Objective:     BP 100/60 (BP Location: Left Arm, Patient Position: Sitting, Cuff Size: Normal)   Pulse 64   Temp 98.3 F (36.8 C) (Oral)   Ht 5\' 2"  (1.575 m)   Wt 155 lb 12.8 oz (70.7 kg)   LMP 02/17/2023 (Exact Date)   SpO2 99%   BMI 28.50 kg/m    Physical Exam Vitals reviewed.  Constitutional:      Appearance: Normal appearance. She is normal weight.  Eyes:      Conjunctiva/sclera: Conjunctivae normal.  Pulmonary:     Effort: Pulmonary effort is normal.  Neurological:     General: No focal deficit present.     Mental Status: She is alert and oriented to person, place, and time. Mental status is at baseline.  Psychiatric:        Mood and Affect: Mood normal. Affect is tearful.        Thought Content: Thought content normal.        Cognition and Memory: Cognition normal.        Judgment: Judgment normal.      No results found for any visits on 03/23/23.    The ASCVD Risk score (Arnett DK, et al., 2019) failed to calculate for the following reasons:   The 2019 ASCVD risk score is only valid for ages 50 to 42    Assessment & Plan:  Diabetes mellitus without complication (HCC) -     C-peptide -     Tirzepatide; Inject 2.5 mg into the skin once a week.  Dispense: 2 mL; Refill: 2  Syncopal episode is most likely either orthostatic hypotension or vasovagal-- her blood sugars are likely still uncontrolled despite the metformin. We had a long conversation about adding medication to help reduce her blood sugars. I explained that this will help  prevent future syncopal episodes because the polyuria will improve with the blood sugars. She is agreeable to starting mounjaro, this was sent to the pharmacy. I also need to check a c-peptide to make sure that she does not have a component of type 1 DM since this was not done previously. I will see her back in 2 months for a repeat A1C check. I gave written instructions for hydration and recommended compression stockings.   I spent 30 minutes with the patient today reviewing her labs, ED notes, medications. I counseled the patient on ways to reduce syncopal events, discussed additional medications for DM, etc.   No follow-ups on file.    Karie Georges, MD

## 2023-03-23 NOTE — Patient Instructions (Signed)
Gatorade Zero  Sugar free Pedialyte  IV hydration-- make sure they are sugar free  Mild compression stockings -- will help support blood pressure

## 2023-03-24 LAB — C-PEPTIDE: C-Peptide: 1.98 ng/mL (ref 0.80–3.85)

## 2023-03-31 ENCOUNTER — Encounter: Payer: Self-pay | Admitting: Family Medicine

## 2023-04-01 NOTE — Telephone Encounter (Signed)
Ok to print paperwork and I will fill out

## 2023-04-01 NOTE — Telephone Encounter (Signed)
Placed in the red folder. 

## 2023-04-05 ENCOUNTER — Telehealth: Payer: Self-pay | Admitting: Family Medicine

## 2023-04-05 DIAGNOSIS — Z0279 Encounter for issue of other medical certificate: Secondary | ICD-10-CM

## 2023-04-05 NOTE — Telephone Encounter (Signed)
Patient dropped off document FMLA, to be filled out by provider. Patient requested to send it back via Call Patient to pick up and fax within 5-days. Document is located in providers tray at front office.Please advise at Mobile [phone] (mobile)

## 2023-04-08 NOTE — Telephone Encounter (Signed)
Left a detailed message at the patient's cell number stating PCP completed the forms, these were faxed to [facility] at [phone] and PCP charged a $29 fee.  Original was left at the front desk for pick up and copy was sent to be scanned.

## 2023-05-06 ENCOUNTER — Ambulatory Visit: Payer: BC Managed Care – PPO | Admitting: Family Medicine

## 2023-05-06 VITALS — BP 108/66 | HR 77 | Temp 98.2°F | Ht 62.0 in | Wt 153.7 lb

## 2023-05-06 DIAGNOSIS — Z7984 Long term (current) use of oral hypoglycemic drugs: Secondary | ICD-10-CM

## 2023-05-06 DIAGNOSIS — E119 Type 2 diabetes mellitus without complications: Secondary | ICD-10-CM | POA: Diagnosis not present

## 2023-05-06 LAB — POCT GLYCOSYLATED HEMOGLOBIN (HGB A1C): Hemoglobin A1C: 9.7 % — AB (ref 4.0–5.6)

## 2023-05-06 MED ORDER — LANCET DEVICE MISC
1.0000 | Freq: Three times a day (TID) | 0 refills | Status: AC
Start: 2023-05-06 — End: 2023-06-05

## 2023-05-06 MED ORDER — LANCETS MISC. MISC
1.0000 | Freq: Every day | 3 refills | Status: AC
Start: 2023-05-06 — End: 2023-06-05

## 2023-05-06 MED ORDER — BLOOD GLUCOSE MONITORING SUPPL DEVI
1.0000 | Freq: Three times a day (TID) | 0 refills | Status: AC
Start: 2023-05-06 — End: ?

## 2023-05-06 MED ORDER — BLOOD GLUCOSE TEST VI STRP
1.0000 | ORAL_STRIP | Freq: Three times a day (TID) | 3 refills | Status: AC
Start: 2023-05-06 — End: ?

## 2023-05-06 MED ORDER — TIRZEPATIDE 2.5 MG/0.5ML ~~LOC~~ SOAJ
2.5000 mg | SUBCUTANEOUS | 5 refills | Status: DC
Start: 2023-05-06 — End: 2024-04-14

## 2023-05-06 MED ORDER — METFORMIN HCL ER 750 MG PO TB24
750.0000 mg | ORAL_TABLET | Freq: Two times a day (BID) | ORAL | 1 refills | Status: DC
Start: 2023-05-06 — End: 2024-04-14

## 2023-05-06 NOTE — Progress Notes (Signed)
Established Patient Office Visit  Subjective   Patient ID: Shari Faulkner, female    DOB: 11-Oct-1990  Age: 32 y.o. MRN: [id number]  Chief Complaint  Patient presents with   Medical Management of Chronic Issues    Pt is here for follow up on her diabetes. She reports that she is working hard to help reduce her blood sugar, states she bought a treadmill and is watching her diet. She is tolerating the metformin XR, states that the mounjaro does cause some nausea, but the ondansetron is helping with this. A1C performed in office today and does show improvement.     Current Outpatient Medications  Medication Instructions   Blood Glucose Monitoring Suppl DEVI 1 each, Does not apply, 3 times daily, May substitute to any manufacturer covered by patient's insurance.   Glucose Blood (BLOOD GLUCOSE TEST STRIPS) STRP 1 each, In Vitro, 3 times daily, May substitute to any manufacturer covered by patient's insurance.   Lancet Device MISC 1 each, Does not apply, 3 times daily, May substitute to any manufacturer covered by patient's insurance.   Lancets Misc. MISC 1 each, Does not apply, Daily, May substitute to any manufacturer covered by patient's insurance.   metFORMIN (GLUCOPHAGE-XR) 750 mg, Oral, 2 times daily   ondansetron (ZOFRAN) 4 mg, Oral, Every 8 hours PRN   tirzepatide (MOUNJARO) 2.5 mg, Subcutaneous, Weekly   VITAMIN D PO 400 Units, Oral, 2 times daily    Patient Active Problem List   Diagnosis Date Noted   Diabetes mellitus without complication (HCC) 11/02/2022   Thyromegaly 10/06/2010      Review of Systems  All other systems reviewed and are negative.     Objective:     BP 108/66 (BP Location: Right Arm, Patient Position: Sitting, Cuff Size: Normal)   Pulse 77   Temp 98.2 F (36.8 C) (Oral)   Ht 5\' 2"  (1.575 m)   Wt 153 lb 11.2 oz (69.7 kg)   LMP 04/20/2023 (Exact Date)   SpO2 97%   BMI 28.11 kg/m    Physical Exam Vitals reviewed.  Constitutional:       Appearance: Normal appearance. She is normal weight.  HENT:     Mouth/Throat:     Mouth: Mucous membranes are moist.  Cardiovascular:     Rate and Rhythm: Normal rate and regular rhythm.     Heart sounds: Normal heart sounds.  Pulmonary:     Effort: Pulmonary effort is normal.     Breath sounds: Normal breath sounds.  Abdominal:     General: Bowel sounds are normal.  Neurological:     Mental Status: She is alert and oriented to person, place, and time. Mental status is at baseline.  Psychiatric:        Mood and Affect: Mood normal.        Behavior: Behavior normal.      Results for orders placed or performed in visit on 05/06/23  POC HgB A1c  Result Value Ref Range   Hemoglobin A1C 9.7 (A) 4.0 - 5.6 %   HbA1c POC (<> result, manual entry)     HbA1c, POC (prediabetic range)     HbA1c, POC (controlled diabetic range)        The ASCVD Risk score (Arnett DK, et al., 2019) failed to calculate for the following reasons:   The 2019 ASCVD risk score is only valid for ages 13 to 63    Assessment & Plan:  Diabetes mellitus without complication [facility]) Assessment &  Plan: A1C is improving on the current medications, I will increase her metformin to BID dosing and see her back in 3 months. Will not increase her mounjaro due to the nausea she is having from the dose.   Orders: -     POCT glycosylated hemoglobin (Hb A1C) -     Tirzepatide; Inject 2.5 mg into the skin once a week.  Dispense: 2 mL; Refill: 5 -     Blood Glucose Monitoring Suppl; 1 each by Does not apply route in the morning, at noon, and at bedtime. May substitute to any manufacturer covered by patient's insurance.  Dispense: 1 each; Refill: 0 -     Blood Glucose Test; 1 each by In Vitro route in the morning, at noon, and at bedtime. May substitute to any manufacturer covered by patient's insurance.  Dispense: 100 strip; Refill: 3 -     Lancet Device; 1 each by Does not apply route in the morning, at noon, and at bedtime.  May substitute to any manufacturer covered by patient's insurance.  Dispense: 1 each; Refill: 0 -     Lancets Misc.; 1 each by Does not apply route daily. May substitute to any manufacturer covered by patient's insurance.  Dispense: 100 each; Refill: 3 -     metFORMIN HCl ER; Take 1 tablet (750 mg total) by mouth in the morning and at bedtime.  Dispense: 180 tablet; Refill: 1     Return in about 3 months (around 08/06/2023) for DM.    Karie Georges, MD

## 2023-05-12 DIAGNOSIS — Z3040 Encounter for surveillance of contraceptives, unspecified: Secondary | ICD-10-CM | POA: Diagnosis not present

## 2023-05-12 DIAGNOSIS — N898 Other specified noninflammatory disorders of vagina: Secondary | ICD-10-CM | POA: Diagnosis not present

## 2023-05-13 ENCOUNTER — Telehealth: Payer: Self-pay | Admitting: Family Medicine

## 2023-05-13 NOTE — Telephone Encounter (Signed)
FMLA to be filled out-placed in dr's folder.  Call [phone] upon completion.

## 2023-05-20 NOTE — Telephone Encounter (Signed)
I left a detailed message stating the PCP completed the forms at no charge and this was left at the front desk, along with the work note as requested.  Copy sent to be scanned.

## 2023-05-24 NOTE — Assessment & Plan Note (Addendum)
A1C is improving on the current medications, I will increase her metformin to BID dosing and see her back in 3 months. Will not increase her mounjaro due to the nausea she is having from the dose.

## 2023-08-04 ENCOUNTER — Other Ambulatory Visit: Payer: Self-pay | Admitting: Family Medicine

## 2023-08-04 DIAGNOSIS — R110 Nausea: Secondary | ICD-10-CM

## 2023-08-06 ENCOUNTER — Ambulatory Visit: Payer: Self-pay | Admitting: Family Medicine

## 2023-08-16 DIAGNOSIS — Z1339 Encounter for screening examination for other mental health and behavioral disorders: Secondary | ICD-10-CM | POA: Diagnosis not present

## 2023-08-16 DIAGNOSIS — Z01419 Encounter for gynecological examination (general) (routine) without abnormal findings: Secondary | ICD-10-CM | POA: Diagnosis not present

## 2023-08-16 DIAGNOSIS — N898 Other specified noninflammatory disorders of vagina: Secondary | ICD-10-CM | POA: Diagnosis not present

## 2023-08-16 DIAGNOSIS — Z113 Encounter for screening for infections with a predominantly sexual mode of transmission: Secondary | ICD-10-CM | POA: Diagnosis not present

## 2023-08-17 ENCOUNTER — Ambulatory Visit: Payer: BC Managed Care – PPO | Admitting: Family Medicine

## 2023-08-17 ENCOUNTER — Encounter: Payer: Self-pay | Admitting: Family Medicine

## 2023-08-17 VITALS — BP 98/58 | HR 72 | Temp 98.6°F | Ht 62.0 in | Wt 148.4 lb

## 2023-08-17 DIAGNOSIS — E119 Type 2 diabetes mellitus without complications: Secondary | ICD-10-CM | POA: Diagnosis not present

## 2023-08-17 DIAGNOSIS — Z7985 Long-term (current) use of injectable non-insulin antidiabetic drugs: Secondary | ICD-10-CM

## 2023-08-17 DIAGNOSIS — Z7984 Long term (current) use of oral hypoglycemic drugs: Secondary | ICD-10-CM

## 2023-08-17 LAB — POCT GLYCOSYLATED HEMOGLOBIN (HGB A1C): Hemoglobin A1C: 5.5 % (ref 4.0–5.6)

## 2023-08-17 NOTE — Progress Notes (Signed)
Established Patient Office Visit  Subjective   Patient ID: Shari Faulkner, female    DOB: [birth year]  Age: 33 y.o. MRN: [id number]  Chief Complaint  Patient presents with   Medical Management of Chronic Issues    Pt is here for follow up on her diabetes. She reports some nausea on the mounjaro, states that the ondansetron is helping to control the nausea. She continues to take the metformin as well. She denies any side effects to the metformin. A1C today is 5.5 which is now well controlled.     Current Outpatient Medications  Medication Instructions   Blood Glucose Monitoring Suppl DEVI 1 each, Does not apply, 3 times daily, May substitute to any manufacturer covered by patient's insurance.   Glucose Blood (BLOOD GLUCOSE TEST STRIPS) STRP 1 each, In Vitro, 3 times daily, May substitute to any manufacturer covered by patient's insurance.   metFORMIN (GLUCOPHAGE-XR) 750 mg, Oral, 2 times daily   ondansetron (ZOFRAN) 4 MG tablet TAKE 1 TABLET BY MOUTH EVERY 8 HOURS AS NEEDED FOR NAUSEA AND VOMITING   sertraline (ZOLOFT) 25 mg, Daily   tirzepatide (MOUNJARO) 2.5 mg, Subcutaneous, Weekly   VITAMIN D PO 400 Units, 2 times daily    Patient Active Problem List   Diagnosis Date Noted   Diabetes mellitus without complication (HCC) 11/02/2022   Thyromegaly 10/06/2010      Review of Systems  All other systems reviewed and are negative.     Objective:     BP (!) 98/58   Pulse 72   Temp 98.6 F (37 C) (Oral)   Ht 5\' 2"  (1.575 m)   Wt 148 lb 6.4 oz (67.3 kg)   LMP 08/03/2023 (Exact Date)   SpO2 99%   BMI 27.14 kg/m    Physical Exam Vitals reviewed.  Constitutional:      Appearance: Normal appearance. She is normal weight.  Eyes:     Conjunctiva/sclera: Conjunctivae normal.  Pulmonary:     Effort: Pulmonary effort is normal.  Musculoskeletal:     Right lower leg: No edema.     Left lower leg: No edema.  Neurological:     Mental Status: She is alert and oriented to  person, place, and time. Mental status is at baseline.  Psychiatric:        Mood and Affect: Mood normal.        Behavior: Behavior normal.      Results for orders placed or performed in visit on 08/17/23  POC HgB A1c  Result Value Ref Range   Hemoglobin A1C 5.5 4.0 - 5.6 %   HbA1c POC (<> result, manual entry)     HbA1c, POC (prediabetic range)     HbA1c, POC (controlled diabetic range)        The ASCVD Risk score (Arnett DK, et al., 2019) failed to calculate for the following reasons:   The 2019 ASCVD risk score is only valid for ages 28 to 55    Assessment & Plan:  Diabetes mellitus without complication (HCC) Assessment & Plan: Now very well controlled, will continue metformin 750 mg twice a day and mounjaro 2.5 mg weekly. She has 2 refills on the ondansetron to take as needed for the nausea. She will RTC in 6 months for her annual physical and labs.   Orders: -     POCT glycosylated hemoglobin (Hb A1C)     Return in about 6 months (around 02/14/2024) for annual physical exam.    Vinetta Bergamo  Casimiro Needle, MD

## 2023-08-17 NOTE — Assessment & Plan Note (Signed)
Now very well controlled, will continue metformin 750 mg twice a day and mounjaro 2.5 mg weekly. She has 2 refills on the ondansetron to take as needed for the nausea. She will RTC in 6 months for her annual physical and labs.

## 2023-08-19 DIAGNOSIS — Z113 Encounter for screening for infections with a predominantly sexual mode of transmission: Secondary | ICD-10-CM | POA: Diagnosis not present

## 2023-08-25 ENCOUNTER — Encounter: Payer: Self-pay | Admitting: Family Medicine

## 2023-08-25 ENCOUNTER — Ambulatory Visit: Payer: BC Managed Care – PPO | Admitting: Family Medicine

## 2023-08-25 VITALS — BP 98/50 | HR 80 | Temp 99.0°F | Ht 62.0 in | Wt 147.5 lb

## 2023-08-25 DIAGNOSIS — I8391 Asymptomatic varicose veins of right lower extremity: Secondary | ICD-10-CM

## 2023-08-25 NOTE — Patient Instructions (Signed)
Compression stockings.

## 2023-08-25 NOTE — Progress Notes (Signed)
   Acute Office Visit  Subjective:     Patient ID: Shari Faulkner, female    DOB: [birth year], 33 y.o.   MRN: [id number]  Chief Complaint  Patient presents with   Mass    Patient complains of a mass noted along the right calf x1 year, worse lately    HPI Patient is in today for a bump on her right calf muscle that has been present for over 1 year, states that it hasn't really gotten bigger, but states that she is feeling it more often, maybe a little tender to touch.  No shooting pains ,no swelling in the ankle, no other associated symptoms.  Review of Systems  All other systems reviewed and are negative.       Objective:    BP (!) 98/50   Pulse 80   Temp 99 F (37.2 C) (Oral)   Ht 5\' 2"  (1.575 m)   Wt 147 lb 8 oz (66.9 kg)   LMP 08/03/2023 (Exact Date)   SpO2 98%   BMI 26.98 kg/m    Physical Exam Vitals reviewed.  Constitutional:      Appearance: Normal appearance. She is normal weight.  Musculoskeletal:     Right lower leg: No swelling. No edema.     Comments: There is a small, rounded lump on the back of the right calf-- it is non tender, not well circumscribed, when applying pressure to the lump it easily reduces/ disappears, indicating a varicose vein that is filled with venous blood,   Neurological:     Mental Status: She is alert.     No results found for any visits on 08/25/23.      Assessment & Plan:   Problem List Items Addressed This Visit   None Visit Diagnoses       Asymptomatic varicose veins of lower extremity, right    -  Primary     Mostly asymptomatic at this time, counseled patient on warning signs of blood clot/ thrombophlebitis, I advised she wear compression stockings to help support the other veins in her leg, we will continue to monitor this area--   No orders of the defined types were placed in this encounter.   No follow-ups on file.  Karie Georges, MD

## 2023-08-30 DIAGNOSIS — E119 Type 2 diabetes mellitus without complications: Secondary | ICD-10-CM | POA: Diagnosis not present

## 2023-08-30 DIAGNOSIS — H40013 Open angle with borderline findings, low risk, bilateral: Secondary | ICD-10-CM | POA: Diagnosis not present

## 2023-09-06 ENCOUNTER — Telehealth: Payer: Self-pay | Admitting: Family Medicine

## 2023-09-06 NOTE — Telephone Encounter (Signed)
 Copied from CRM [id number]. Topic: Clinical - Medical Advice >> Sep 06, 2023  2:58 PM Tiffany H wrote: Reason for CRM: Emma with Dr. Michaelyn Adu, a third party for [employer], called to request a peer to peer. Please advise.   Phone: [phone]

## 2023-09-10 NOTE — Telephone Encounter (Signed)
Spoke with the reviewing physician and he will be meeting with them again to decide on the short term disability leave.

## 2023-10-19 ENCOUNTER — Telehealth: Payer: Self-pay | Admitting: Family Medicine

## 2023-10-19 NOTE — Telephone Encounter (Signed)
 Patient dropped off document  medical accommodation request , to be filled out by provider. Patient requested to send it back via Call Patient to pick up and fax within 7-days. Document is located in providers tray at front office.Please advise at Mobile [phone] (mobile)

## 2023-10-20 NOTE — Telephone Encounter (Signed)
 I looked over the forms-- she is going to need an appointment to discuss-- she does not have a diagnosis of anxiety/depression listed and they are going to need a treatment/ plan of care that they see she is going to engage in . Please have her schedule an appointment so we can discuss her symptoms and discuss a plan of care.

## 2023-10-20 NOTE — Telephone Encounter (Signed)
 Spoke with the patient and informed her of the message below.  Patient stated she does have anxiety and depression and is being treated by a therapist.  I advised the patient she may want to have the therapist complete her forms since they are treating her for the problem.  Patient stated she would prefer to see PCP, an appt was scheduled on 4/1 and she was advised to discuss this with PCP during the visit.

## 2023-10-26 ENCOUNTER — Ambulatory Visit: Admitting: Family Medicine

## 2023-10-26 ENCOUNTER — Encounter: Payer: Self-pay | Admitting: Family Medicine

## 2023-10-26 VITALS — BP 100/60 | HR 75 | Temp 98.8°F | Ht 62.0 in | Wt 142.2 lb

## 2023-10-26 DIAGNOSIS — J029 Acute pharyngitis, unspecified: Secondary | ICD-10-CM | POA: Diagnosis not present

## 2023-10-26 DIAGNOSIS — F331 Major depressive disorder, recurrent, moderate: Secondary | ICD-10-CM | POA: Diagnosis not present

## 2023-10-26 LAB — POCT RAPID STREP A (OFFICE): Rapid Strep A Screen: NEGATIVE

## 2023-10-26 LAB — POC COVID19 BINAXNOW: SARS Coronavirus 2 Ag: NEGATIVE

## 2023-10-26 NOTE — Progress Notes (Unsigned)
 Established Patient Office Visit  Subjective   Patient ID: Shari Faulkner, female    DOB: 01/07/1991  Age: 33 y.o. MRN: [id number]  Chief Complaint  Patient presents with   Patient requests FMLA forms due to anxiety   Sore Throat    X1 day and sneezing x1 day, tried honey tea and lemon with some relief    Pt is reporting that she is seeing a therapist Dr. Rosezetta Schlatter via telemedicine, has been seeing her since January 2025. Patient was diagnosed with MDD and anxiety, but thinks she has had the symptoms for a long time. States that her mother died about 3 years ago of brain cancer and has been having these feelings ever since. States that the GYN sent in rx for sertraline. States that she was started on the sertraline in January, she reports that she feels like it is helping, is on 100 mg daily, states that she was told that she would be on the medication. Is requesting work from home accommodation to help reduce anxiety.. states she gets more anxious being around people, states that people come up to her at work and this makes her nervous. 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 Having to sit in a seat for 8-9 hours is very difficult for her. States that she was taken out of work for her anxiety in January and February and now that she is back at work.  Needs frequent breaks, has to go to the bathroom a lot due to her diabetes and that is discouraged. States she cannot really prepare the food she needs to eat due to there diabetes as well.   Sore Throat  This is a new problem. The current episode started today. The problem has been unchanged. There has been no fever. Associated symptoms include coughing and a hoarse voice. Pertinent negatives include no ear pain. She has tried nothing for the symptoms. The treatment provided no relief.    Current Outpatient Medications  Medication Instructions   Blood Glucose Monitoring Suppl DEVI 1  each, Does not apply, 3 times daily, May substitute to any manufacturer covered by patient's insurance.   Glucose Blood (BLOOD GLUCOSE TEST STRIPS) STRP 1 each, In Vitro, 3 times daily, May substitute to any manufacturer covered by patient's insurance.   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 Negative for ear pain.   Respiratory:  Positive for cough.   All other systems reviewed and are negative.     Objective:     BP 100/60   Pulse 75   Temp 98.8 F (37.1 C) (Oral)   Ht 5\' 2"  (1.575 m)   Wt 142 lb 3.2 oz (64.5 kg)   LMP 10/22/2023 (Exact Date)   SpO2 98%   BMI 26.01 kg/m  {Vitals History (Optional):23777}  Physical Exam Vitals reviewed.  Constitutional:      Appearance: She is  well-developed.  Neurological:     Mental Status: She is alert.      No results found for any visits on 10/26/23.  {Labs (Optional):23779}  The ASCVD Risk score (Arnett DK, et al., 2019) failed to calculate for the following reasons:   The 2019 ASCVD risk score is only valid for ages 51 to 37    Assessment & Plan:  Sore throat -     POC COVID-19 BinaxNow -     POCT rapid strep A  Major depressive disorder, recurrent episode, moderate degree (HCC)     Return in about 4 months (around 02/25/2024) for annual physical exam.    Karie Georges, MD

## 2023-10-28 NOTE — Telephone Encounter (Signed)
 PCP completed fhe forms with NO CHARGE since the patient was here for an appt previously.  Detailed message left at the patient's cell number stating the original was left at the front desk for pick up, faxed to [employer]-attn: MA team at [phone] and a copy was sent to be scanned.

## 2023-10-30 ENCOUNTER — Encounter: Payer: Self-pay | Admitting: Family Medicine

## 2024-02-14 ENCOUNTER — Encounter: Payer: BC Managed Care – PPO | Admitting: Family Medicine

## 2024-02-14 ENCOUNTER — Telehealth: Payer: Self-pay | Admitting: Family Medicine

## 2024-04-14 ENCOUNTER — Ambulatory Visit (INDEPENDENT_AMBULATORY_CARE_PROVIDER_SITE_OTHER): Admitting: Family Medicine

## 2024-04-14 ENCOUNTER — Encounter: Payer: Self-pay | Admitting: Family Medicine

## 2024-04-14 VITALS — BP 100/68 | HR 70 | Temp 99.2°F | Ht 62.0 in | Wt 147.1 lb

## 2024-04-14 DIAGNOSIS — Z0000 Encounter for general adult medical examination without abnormal findings: Secondary | ICD-10-CM | POA: Diagnosis not present

## 2024-04-14 DIAGNOSIS — F331 Major depressive disorder, recurrent, moderate: Secondary | ICD-10-CM | POA: Diagnosis not present

## 2024-04-14 DIAGNOSIS — E119 Type 2 diabetes mellitus without complications: Secondary | ICD-10-CM | POA: Diagnosis not present

## 2024-04-14 DIAGNOSIS — N63 Unspecified lump in unspecified breast: Secondary | ICD-10-CM | POA: Diagnosis not present

## 2024-04-14 DIAGNOSIS — Z23 Encounter for immunization: Secondary | ICD-10-CM

## 2024-04-14 LAB — COMPREHENSIVE METABOLIC PANEL WITH GFR
ALT: 8 U/L (ref 0–35)
AST: 11 U/L (ref 0–37)
Albumin: 4.1 g/dL (ref 3.5–5.2)
Alkaline Phosphatase: 63 U/L (ref 39–117)
BUN: 7 mg/dL (ref 6–23)
CO2: 29 meq/L (ref 19–32)
Calcium: 9.2 mg/dL (ref 8.4–10.5)
Chloride: 105 meq/L (ref 96–112)
Creatinine, Ser: 0.76 mg/dL (ref 0.40–1.20)
GFR: 101.86 mL/min (ref >60.00)
Glucose, Bld: 111 mg/dL — ABNORMAL HIGH (ref 70–99)
Potassium: 3.9 meq/L (ref 3.5–5.1)
Sodium: 138 meq/L (ref 135–145)
Total Bilirubin: 0.4 mg/dL (ref 0.2–1.2)
Total Protein: 6.8 g/dL (ref 6.0–8.3)

## 2024-04-14 LAB — CBC WITH DIFFERENTIAL/PLATELET
Basophils Absolute: 0.1 K/uL (ref 0.0–0.1)
Basophils Relative: 0.7 % (ref 0.0–3.0)
Eosinophils Absolute: 0.2 K/uL (ref 0.0–0.7)
Eosinophils Relative: 1.5 % (ref 0.0–5.0)
HCT: 40 % (ref 36.0–46.0)
Hemoglobin: 13.1 g/dL (ref 12.0–15.0)
Lymphocytes Relative: 28.2 % (ref 12.0–46.0)
Lymphs Abs: 3 K/uL (ref 0.7–4.0)
MCHC: 32.6 g/dL (ref 30.0–36.0)
MCV: 86.8 fl (ref 78.0–100.0)
Monocytes Absolute: 1 K/uL (ref 0.1–1.0)
Monocytes Relative: 9.3 % (ref 3.0–12.0)
Neutro Abs: 6.3 K/uL (ref 1.4–7.7)
Neutrophils Relative %: 60.3 % (ref 43.0–77.0)
Platelets: 167 K/uL (ref 150.0–400.0)
RBC: 4.61 Mil/uL (ref 3.87–5.11)
RDW: 14.7 % (ref 11.5–15.5)
WBC: 10.5 K/uL (ref 4.0–10.5)

## 2024-04-14 LAB — LIPID PANEL
Cholesterol: 180 mg/dL (ref 0–200)
HDL: 43.6 mg/dL (ref >39.00)
LDL Cholesterol: 113 mg/dL — ABNORMAL HIGH (ref 0–99)
NonHDL: 136.48
Total CHOL/HDL Ratio: 4
Triglycerides: 116 mg/dL (ref 0.0–149.0)
VLDL: 23.2 mg/dL (ref 0.0–40.0)

## 2024-04-14 LAB — MICROALBUMIN / CREATININE URINE RATIO
Creatinine,U: 190.5 mg/dL
Microalb Creat Ratio: 4.3 mg/g (ref 0.0–30.0)
Microalb, Ur: 0.8 mg/dL (ref 0.0–1.9)

## 2024-04-14 LAB — POCT GLYCOSYLATED HEMOGLOBIN (HGB A1C): Hemoglobin A1C: 5.5 % (ref 4.0–5.6)

## 2024-04-14 MED ORDER — SERTRALINE HCL 100 MG PO TABS: 150.0000 mg | ORAL_TABLET | Freq: Every day | ORAL | 3 refills | Status: DC

## 2024-04-14 NOTE — Progress Notes (Signed)
 Complete physical exam  Patient: Shari Faulkner   DOB: [birth year]   33 y.o. Female  MRN: [id number]  Subjective:    Chief Complaint  Patient presents with   Annual Exam   Chest Pain    Right-sided, intermittently x1 month    Shari Faulkner is a 33 y.o. female who presents today for a complete physical exam. She reports consuming a general diet. Pt avoids no added sugar, no fast food, occasional snacking with candy and chips, drinks sugar free drinks. Home exercise routine includes walking 2 hrs per week. Works out at [employer] on Sundays. She generally feels well. She reports sleeping well. She does not have additional problems to discuss today.   Of note, patient complained of right breast pain that felt like a heaviness for the past 1 month. Reports that her mother had ovarian cancer, paternal grandmother had breast cancer.  She reports that if she presses on the right breast it relieves the pain a bit.   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 May substitute to any manufacturer covered by patient's insurance.   Glucose Blood (BLOOD GLUCOSE TEST STRIPS) STRP 1 each by In Vitro route in the morning, at noon, and at bedtime. May substitute to any  manufacturer covered by patient's insurance.   ondansetron  (ZOFRAN ) 4 MG tablet TAKE 1 TABLET BY MOUTH EVERY 8 HOURS AS NEEDED FOR NAUSEA AND VOMITING   VITAMIN D PO Take 400 Units by mouth daily.   [DISCONTINUED] metFORMIN  (GLUCOPHAGE -XR) 750 MG 24 hr tablet Take 1 tablet (750 mg total) by mouth in the morning and at bedtime.   [DISCONTINUED] sertraline  (ZOLOFT ) 100 MG tablet Take 100 mg by mouth daily. (Patient taking differently: Take 200 mg by mouth daily.)   [DISCONTINUED] tirzepatide  (MOUNJARO ) 2.5 MG/0.5ML Pen Inject 2.5 mg into the skin once a week.   No facility-administered medications prior to visit.    Review of Systems  HENT:  Negative for hearing loss.   Eyes:  Negative for blurred vision.  Respiratory:  Negative for shortness of breath.   Cardiovascular:  Negative for chest pain.  Gastrointestinal: Negative.   Genitourinary: Negative.   Musculoskeletal:  Negative for back pain.  Neurological:  Negative for headaches.  Psychiatric/Behavioral:  Negative for depression.        Objective:     BP 100/68   Pulse 70   Temp 99.2 F (37.3 C) (Oral)   Ht 5' 2 (1.575 m)   Wt 147 lb 1.6 oz (66.7 kg)   LMP 03/26/2024 (Exact Date)   SpO2 98%   BMI 26.90 kg/m  Physical Exam Vitals reviewed.  Constitutional:      Appearance: Normal appearance. She is well-groomed and normal weight.  HENT:     Right Ear: Tympanic membrane and ear canal normal.     Left Ear: Tympanic membrane and ear canal normal.     Mouth/Throat:     Mouth: Mucous membranes are moist.     Pharynx: No posterior oropharyngeal erythema.  Eyes:     Conjunctiva/sclera: Conjunctivae normal.  Neck:     Thyroid : No thyromegaly.  Cardiovascular:     Rate and Rhythm: Normal rate and regular rhythm.     Pulses: Normal pulses.     Heart sounds: S1 normal and S2 normal.  Pulmonary:     Effort: Pulmonary effort is normal.     Breath sounds: Normal breath sounds and air entry.  Chest:     Chest wall:  Mass (right breast there is a well defined soft, freely moveable cyst in the 11 o'clock position, no axillare LAD) and tenderness present.  Abdominal:     General: Abdomen is flat. Bowel sounds are normal.     Palpations: Abdomen is soft.  Musculoskeletal:     Right lower leg: No edema.     Left lower leg: No edema.  Lymphadenopathy:     Cervical: No cervical adenopathy.  Neurological:     Mental Status: She is alert and oriented to person, place, and time. Mental status is at baseline.     Gait: Gait is intact.  Psychiatric:        Mood and Affect: Mood and affect normal.        Speech: Speech normal.        Behavior: Behavior normal.        Judgment: Judgment normal.         Assessment & Plan:    Routine Health Maintenance and Physical Exam  Immunization History  Administered Date(s) Administered   Hep B, Unspecified 04/14/2000, 05/19/2000, 10/14/2000   Influenza, Mdck, Trivalent,PF 6+ MOS(egg free) 03/05/2024   Influenza-Unspecified 03/05/2024   Tdap 01/18/2007, 04/14/2024    Health Maintenance  Topic Date Due   Pneumococcal Vaccine (1 of 2 - PCV) Never done   HPV VACCINES (1 - Risk 3-dose SCDM series) Never done   Cervical Cancer Screening (HPV/Pap Cotest)  Never done   OPHTHALMOLOGY EXAM  07/28/2023   COVID-19 Vaccine (1 - 2024-25 season) Never done   Hepatitis C Screening  04/14/2025 (Originally 05/07/2007)   HIV Screening  04/14/2025 (Originally 05/06/2004)   HEMOGLOBIN A1C  10/12/2024   Diabetic kidney evaluation - eGFR measurement  04/14/2025   Diabetic kidney evaluation - Urine ACR  04/14/2025   FOOT EXAM  04/14/2025   DTaP/Tdap/Td (3 - Td or Tdap) 04/14/2034   Influenza Vaccine  Completed   Meningococcal B Vaccine  Aged Out    Discussed health benefits of physical activity, and encouraged her to engage in regular exercise appropriate for her age and condition.  Diabetes mellitus without complication (HCC) -     POCT glycosylated hemoglobin (Hb A1C) -      Collection capillary blood specimen -     Microalbumin / creatinine urine ratio; Future -     CBC with Differential/Platelet; Future -     Comprehensive metabolic panel with GFR; Future -     Lipid panel; Future  Routine general medical examination at a health care facility  Major depressive disorder, recurrent episode, moderate degree (HCC) -     Sertraline  HCl;  Take 1.5 tablets (150 mg total) by mouth daily.  Dispense: 45 tablet; Refill: 3  Immunization due -     Tdap vaccine greater than or equal to 7yo IM  Breast mass in female -     MM 3D DIAGNOSTIC MAMMOGRAM UNILATERAL RIGHT BREAST; Future  General physical exam findings are normal today except for the breast cyst. It is most likely benign however I will order diagnostic mammogram to start her work up. I reviewed the patient's preventative testing, immunizations, and lifestyle habits. I made appropriate recommendations and placed orders for the appropriate tests and/or vaccinations. I counseled the patient on the CDC's recommendations for healthy exercise and diet. I counseled the patient on healthy sleep habits and stress management. Handouts to reinforce the counseling were given at the conclusion of the visit.    Return in 6 months (on 10/12/2024) for DM and depression.     Heron CHRISTELLA Sharper, MD

## 2024-04-14 NOTE — Patient Instructions (Signed)

## 2024-04-17 ENCOUNTER — Other Ambulatory Visit: Payer: Self-pay | Admitting: Family Medicine

## 2024-04-17 DIAGNOSIS — N63 Unspecified lump in unspecified breast: Secondary | ICD-10-CM

## 2024-04-21 ENCOUNTER — Ambulatory Visit: Payer: Self-pay | Admitting: Family Medicine

## 2024-04-26 ENCOUNTER — Ambulatory Visit
Admission: RE | Admit: 2024-04-26 | Discharge: 2024-04-26 | Disposition: A | Discharge status: Home / Self Care | Source: Ambulatory Visit | Attending: Family Medicine | Admitting: Family Medicine

## 2024-04-26 ENCOUNTER — Other Ambulatory Visit: Payer: Self-pay | Admitting: Family Medicine

## 2024-04-26 ENCOUNTER — Ambulatory Visit
Admission: RE | Admit: 2024-04-26 | Discharge: 2024-04-26 | Disposition: A | Source: Ambulatory Visit | Attending: Family Medicine

## 2024-04-26 DIAGNOSIS — N63 Unspecified lump in unspecified breast: Secondary | ICD-10-CM

## 2024-04-26 DIAGNOSIS — E119 Type 2 diabetes mellitus without complications: Secondary | ICD-10-CM

## 2024-04-26 DIAGNOSIS — N6489 Other specified disorders of breast: Secondary | ICD-10-CM | POA: Diagnosis not present

## 2024-04-26 DIAGNOSIS — F331 Major depressive disorder, recurrent, moderate: Secondary | ICD-10-CM

## 2024-04-26 DIAGNOSIS — Z23 Encounter for immunization: Secondary | ICD-10-CM

## 2024-04-26 DIAGNOSIS — R928 Other abnormal and inconclusive findings on diagnostic imaging of breast: Secondary | ICD-10-CM | POA: Diagnosis not present

## 2024-04-26 DIAGNOSIS — Z0000 Encounter for general adult medical examination without abnormal findings: Secondary | ICD-10-CM

## 2024-05-02 ENCOUNTER — Ambulatory Visit: Payer: Self-pay | Admitting: Family Medicine

## 2024-05-12 ENCOUNTER — Other Ambulatory Visit: Payer: Self-pay | Admitting: Family Medicine

## 2024-05-12 DIAGNOSIS — F331 Major depressive disorder, recurrent, moderate: Secondary | ICD-10-CM
